# Patient Record
Sex: Male | Born: 1965 | Race: White | Hispanic: No | Marital: Married | Smoking: Never smoker
Health system: Southern US, Community
[De-identification: ages and names within clinical notes are randomized; demographics above are authoritative.]

## PROBLEM LIST (undated history)

## (undated) DIAGNOSIS — K219 Gastro-esophageal reflux disease without esophagitis: Secondary | ICD-10-CM

## (undated) DIAGNOSIS — C801 Malignant (primary) neoplasm, unspecified: Secondary | ICD-10-CM

## (undated) DIAGNOSIS — G4730 Sleep apnea, unspecified: Secondary | ICD-10-CM

## (undated) DIAGNOSIS — M1990 Unspecified osteoarthritis, unspecified site: Secondary | ICD-10-CM

## (undated) DIAGNOSIS — T7840XA Allergy, unspecified, initial encounter: Secondary | ICD-10-CM

## (undated) HISTORY — DX: Allergy, unspecified, initial encounter: T78.40XA

## (undated) HISTORY — PX: MELANOMA EXCISION: SHX5266

## (undated) HISTORY — PX: COLONOSCOPY: SHX5424

---

## 2001-10-10 ENCOUNTER — Ambulatory Visit (HOSPITAL_BASED_OUTPATIENT_CLINIC_OR_DEPARTMENT_OTHER): Admission: RE | Admit: 2001-10-10 | Discharge: 2001-10-10 | Payer: Self-pay | Admitting: General Surgery

## 2001-10-10 ENCOUNTER — Encounter (INDEPENDENT_AMBULATORY_CARE_PROVIDER_SITE_OTHER): Payer: Self-pay | Admitting: *Deleted

## 2005-09-12 ENCOUNTER — Ambulatory Visit (HOSPITAL_COMMUNITY): Admission: RE | Admit: 2005-09-12 | Discharge: 2005-09-12 | Payer: Self-pay | Admitting: Gastroenterology

## 2007-10-08 ENCOUNTER — Observation Stay (HOSPITAL_COMMUNITY): Admission: EM | Admit: 2007-10-08 | Discharge: 2007-10-09 | Payer: Self-pay | Admitting: *Deleted

## 2011-03-06 NOTE — H&P (Signed)
Isaac [name], Isaac [name] NO.:  [id number]   MEDICAL RECORD NO.:  [id number]          PATIENT TYPE:  INP   LOCATION:  0102                         FACILITY:  [facility]   PHYSICIAN:  Hollice Espy, M.D.DATE OF BIRTH:  [birth year]   DATE OF ADMISSION:  10/08/2007  DATE OF DISCHARGE:                              HISTORY & PHYSICAL   PRIMARY CARE PHYSICIAN:  C. Duane Lope, MD   CHIEF COMPLAINT:  Melena.   HISTORY OF PRESENT ILLNESS:  The patient is a 45 year old white male  with no past medical history other than recent flu-like symptoms for the  last 4-5 days.  He says that over the past few days he has probably  taken about a total of 8 Motrin 200 mg with the most at 1 setting 4,  which is within the dose recommended on the bottle.  He says that he has  been fine.  His flu-like symptoms have resolved, but then yesterday he  had an episode of black, tarry stool.  He was a little bit concerned,  but then became more concerned today when he had another episode.  He  has had no other symptoms of lightheadedness, dizziness, nausea,  vomiting, chest pain or other symptoms, but concerned about what was  going on, he came into the emergency room for further evaluation.  In  the emergency room the patient was noted to have a normal white count  with no shift.  Normal H and H.  Normal BUN, minimally elevated  creatinine, and normal blood pressure.  He currently is doing well.  Initially he wanted to leave even against medical advice, but after some  discussion was convinced to stay at least overnight.  Currently he is  doing well.  He denies any headaches, vision changes, dysphagia, chest  pain, palpitations, shortness of breath, wheeze, cough, abdominal pain,  hematuria, dysuria, constipation, diarrhea, focal extremity numbness,  weakness, or pain.   REVIEW OF SYSTEMS:  Otherwise negative.   PAST MEDICAL HISTORY:  None.   MEDICATIONS:  None.   ALLERGIES:  NONE.   SOCIAL HISTORY:  No tobacco, alcohol, or drug use.   FAMILY HISTORY:  Noncontributory.   PHYSICAL EXAMINATION:  VITAL SIGNS:  Temperature 97.7.  Heart rate 101,  now down to 85.  Blood pressure 140/94, down to 124/89.  Respirations  20.  O2 sat 96% on room air.  GENERAL:  He is alert and oriented x3.  In no apparent distress.  HEENT:  Normocephalic.  Atraumatic.  His mucous membranes are moist.  He  has no carotid bruits.  HEART:  Regular rate and rhythm.  S1 and S2.  LUNGS:  Clear to auscultation bilaterally.  ABDOMEN:  Soft, nontender, nondistended.  Positive bowel sounds.  EXTREMITIES:  Show no clubbing, cyanosis, or edema.   LABORATORY WORK:  UA notes trace hemoglobin, trace ketones, with some  calcium oxalate crystals present.  White count 6.2.  H and H 15 and 43.  MCV of 88.  Platelet count of 206.  No shift.  Sodium 126.  Potassium  3.8.  Chloride  100.  Bicarb 28.  BUN 16.  Creatinine 1.19.  Glucose 94.  Acute abdominal series is negative.   ASSESSMENT AND PLAN:  Melena.  Concerned about the possibility of an  upper gastrointestinal bleed.  Convinced the patient to stay overnight.  Will repeat a BMET and hemoglobin and hematocrit in the morning.  In the  meantime monitor his heart rate and blood pressure.  If he has any  decline in his blood pressure or his hemoglobin and hematocrit, or an  increase in his BUN and creatinine, or increase in his heart rate, will  plan to further follow with endoscopy.  The patient is amenable to it.  If his vitals and labs stay stable, plan to discharge the patient on  oral proton pump inhibitor and have advised to stop nonsteroidal anti-  inflammatory drugs and follow up with his primary care physician.      Hollice Espy, M.D.  Electronically Signed     SKK/MEDQ  D:  10/08/2007  T:  10/09/2007  Job:  [id number]   cc:   C. Duane Lope, M.D.  Fax: [phone]

## 2011-03-09 NOTE — Discharge Summary (Signed)
NAMEHALDEN, PHEGLEY NO.:  [id number]   MEDICAL RECORD NO.:  [id number]          PATIENT TYPE:  OBV   LOCATION:  1432                         FACILITY:  [facility]   PHYSICIAN:  Michelene Gardener, MD    DATE OF BIRTH:  08/23/1966   DATE OF ADMISSION:  10/08/2007  DATE OF DISCHARGE:  10/09/2007                               DISCHARGE SUMMARY   PRIMARY CARE PHYSICIAN:  C. Duane Lope, M.D.   DISCHARGE DIAGNOSES:  1. Gastroesophageal reflux disease.  2. Questionable gastrointestinal bleed.   DISCHARGE MEDICATIONS:  None.   PROCEDURES:  None.   CONSULTATIONS:  None.   FOLLOW-UP APPOINTMENT:  With primary physician in 2 weeks.   COURSE OF HOSPITALIZATION:  This is a 45 year old male with no  significant past medical history.  He presented to the hospital with flu-  like symptoms.  He was taking 8of  Motrin for a few days; he one episode  of black stool.  That was questionable for GI bleed because the patient  was on Motrin.  At the time of admission his hemoglobin was 15.2.  Repeat hemoglobin the next day was 14.4, even after IV fluids.  The  patient was admitted to the hospital for observation.  He was given IV  fluids.  There were no more episodes of bleed.  The patient had a guaiac  test that came to be negative.  Vitals remained stable during the  hospital.  I suggested a GI consultation, but the patient refused and he  preferred to follow as an outpatient.  He was advised to stay away from  Motrin at this point.  He was advised to see his primary physician in 1-  2 weeks, to repeat his hemoglobin and to assess the need of GI  evaluation.  Otherwise, his other medical conditions remained stable  during the hospital.   ASSESSMENT TIME:  20 minutes.      Michelene Gardener, MD  Electronically Signed     NAE/MEDQ  D:  12/18/2007  T:  12/19/2007  Job:  [id number]   cc:   C. Duane Lope, M.D.  Fax: [phone]

## 2011-03-09 NOTE — Op Note (Signed)
NAMEPHILLIP, Isaac [name] NO.:  [id number]   MEDICAL RECORD NO.:  [id number]          PATIENT TYPE:  AMB   LOCATION:  ENDO                         FACILITY:  [facility]   PHYSICIAN:  Danise Edge, M.D.   DATE OF BIRTH:  1966-09-15   DATE OF PROCEDURE:  09/12/2005  DATE OF DISCHARGE:                                 OPERATIVE REPORT   PROCEDURE:  Screening colonoscopy.   REFERRED BY:  C. Duane Lope, M.D.   INDICATIONS FOR PROCEDURE:  Mr. Isaac Sedano. [name] is a 45 year old male born  07-25-1966. Mr. Isaac [name] grandmother had colon cancer and his mother  has undergone colonoscopic exams to remove neoplastic colon polyps.   ENDOSCOPIST:  Danise Edge, M.D.   PREMEDICATION:  Versed 7.5 mg, Demerol 70 mg.   DESCRIPTION OF PROCEDURE:  After obtaining informed consent, [name] was  placed in the left lateral decubitus position. I administered intravenous  Demerol and intravenous Versed to achieve conscious sedation for the  procedure. The patient's blood pressure, oxygen saturation and cardiac  rhythm were monitored throughout the procedure and documented in the medical  record.   Anal inspection was normal. Digital rectal exam reveals a non-nodular  prostate. The Olympus adjustable pediatric colonoscope was introduced into  the rectum and advanced to the cecum. A normal appearing ileocecal valve was  identified. Colonic preparation for the exam today was excellent.   RECTUM:  Normal. Retroflexed view of the distal rectum normal.  SIGMOID COLON AND DESCENDING COLON:  Normal.  SPLENIC FLEXURE:  Normal.  TRANSVERSE COLON:  Normal.  HEPATIC FLEXURE:  Normal.  ASCENDING COLON:  Normal.  CECUM AND ILEOCECAL VALVE:  Normal.   ASSESSMENT:  Normal screening proctocolonoscopy to the cecum.   RECOMMENDATIONS:  Colonoscopy in 5-10 years.           ______________________________  Danise Edge, M.D.     MJ/MEDQ  D:  09/12/2005  T:  09/12/2005  Job:  [id number]   cc:   C. Duane Lope, M.D.  Fax: [phone]

## 2011-07-27 LAB — CBC
HCT: 43
Hemoglobin: 15.2
RBC: 4.88
WBC: 6.2

## 2011-07-27 LAB — BASIC METABOLIC PANEL
BUN: 16
Calcium: 8.6
Chloride: 101
GFR calc non Af Amer: >60
Glucose, Bld: 84
Glucose, Bld: 94
Potassium: 3.8
Sodium: 136

## 2011-07-27 LAB — DIFFERENTIAL
Basophils Absolute: 0
Basophils Relative: 0
Eosinophils Absolute: 0.1 — ABNORMAL LOW
Lymphs Abs: 1
Monocytes Absolute: 0.7
Monocytes Relative: 12

## 2011-07-27 LAB — URINALYSIS, ROUTINE W REFLEX MICROSCOPIC

## 2014-07-01 ENCOUNTER — Other Ambulatory Visit: Payer: Self-pay | Admitting: Gastroenterology

## 2014-07-01 DIAGNOSIS — K3189 Other diseases of stomach and duodenum: Secondary | ICD-10-CM

## 2014-07-01 DIAGNOSIS — R1013 Epigastric pain: Principal | ICD-10-CM

## 2014-07-06 ENCOUNTER — Other Ambulatory Visit: Payer: Self-pay

## 2014-07-08 ENCOUNTER — Ambulatory Visit
Admission: RE | Admit: 2014-07-08 | Discharge: 2014-07-08 | Disposition: A | Discharge status: Home / Self Care | Payer: BC Managed Care – PPO | Source: Ambulatory Visit | Attending: Gastroenterology | Admitting: Gastroenterology

## 2014-07-08 ENCOUNTER — Encounter (INDEPENDENT_AMBULATORY_CARE_PROVIDER_SITE_OTHER): Payer: Self-pay

## 2014-07-08 DIAGNOSIS — R1013 Epigastric pain: Principal | ICD-10-CM

## 2014-07-08 DIAGNOSIS — K3189 Other diseases of stomach and duodenum: Secondary | ICD-10-CM

## 2015-11-03 ENCOUNTER — Other Ambulatory Visit: Payer: Self-pay | Admitting: Gastroenterology

## 2015-12-07 ENCOUNTER — Encounter (HOSPITAL_COMMUNITY): Payer: Self-pay | Admitting: *Deleted

## 2015-12-19 ENCOUNTER — Encounter (HOSPITAL_COMMUNITY)
Admission: RE | Disposition: A | Discharge status: Home / Self Care | Payer: Self-pay | Source: Ambulatory Visit | Attending: Gastroenterology

## 2015-12-19 ENCOUNTER — Encounter (HOSPITAL_COMMUNITY): Payer: Self-pay | Admitting: Certified Registered Nurse Anesthetist

## 2015-12-19 ENCOUNTER — Ambulatory Visit (HOSPITAL_COMMUNITY): Payer: BLUE CROSS/BLUE SHIELD | Admitting: Certified Registered Nurse Anesthetist

## 2015-12-19 ENCOUNTER — Ambulatory Visit (HOSPITAL_COMMUNITY)
Admission: RE | Admit: 2015-12-19 | Discharge: 2015-12-19 | Disposition: A | Discharge status: Home / Self Care | Payer: BLUE CROSS/BLUE SHIELD | Source: Ambulatory Visit | Attending: Gastroenterology | Admitting: Gastroenterology

## 2015-12-19 DIAGNOSIS — K219 Gastro-esophageal reflux disease without esophagitis: Secondary | ICD-10-CM | POA: Insufficient documentation

## 2015-12-19 DIAGNOSIS — Z800 Family history of malignant neoplasm of digestive organs: Secondary | ICD-10-CM | POA: Diagnosis not present

## 2015-12-19 DIAGNOSIS — Z85828 Personal history of other malignant neoplasm of skin: Secondary | ICD-10-CM | POA: Insufficient documentation

## 2015-12-19 DIAGNOSIS — Z1211 Encounter for screening for malignant neoplasm of colon: Secondary | ICD-10-CM | POA: Insufficient documentation

## 2015-12-19 DIAGNOSIS — J385 Laryngeal spasm: Secondary | ICD-10-CM | POA: Insufficient documentation

## 2015-12-19 HISTORY — DX: Gastro-esophageal reflux disease without esophagitis: K21.9

## 2015-12-19 HISTORY — PX: COLONOSCOPY WITH PROPOFOL: SHX5780

## 2015-12-19 HISTORY — DX: Sleep apnea, unspecified: G47.30

## 2015-12-19 HISTORY — DX: Unspecified osteoarthritis, unspecified site: M19.90

## 2015-12-19 HISTORY — DX: Malignant (primary) neoplasm, unspecified: C80.1

## 2015-12-19 SURGERY — COLONOSCOPY WITH PROPOFOL
Anesthesia: Monitor Anesthesia Care

## 2015-12-19 MED ORDER — PROPOFOL 10 MG/ML IV BOLUS
INTRAVENOUS | Status: AC
  Filled 2015-12-19: qty 40

## 2015-12-19 MED ORDER — PROPOFOL 500 MG/50ML IV EMUL
INTRAVENOUS | Status: DC | PRN
  Administered 2015-12-19 (×3): 30 mg via INTRAVENOUS

## 2015-12-19 MED ORDER — PROPOFOL 500 MG/50ML IV EMUL
INTRAVENOUS | Status: DC | PRN
  Administered 2015-12-19: 100 ug/kg/min via INTRAVENOUS

## 2015-12-19 MED ORDER — LACTATED RINGERS IV SOLN
INTRAVENOUS | Status: DC
  Administered 2015-12-19: 1000 mL via INTRAVENOUS
  Administered 2015-12-19: 11:00:00 via INTRAVENOUS

## 2015-12-19 MED ORDER — SODIUM CHLORIDE 0.9 % IV SOLN: INTRAVENOUS | Status: DC

## 2015-12-19 MED ORDER — BUTAMBEN-TETRACAINE-BENZOCAINE 2-2-14 % EX AERO
INHALATION_SPRAY | CUTANEOUS | Status: DC | PRN
  Administered 2015-12-19: 1 via TOPICAL

## 2015-12-19 SURGICAL SUPPLY — 24 items

## 2015-12-19 NOTE — Op Note (Signed)
Procedure: Screening colonoscopy. Chronic gastroesophageal reflux. 09/12/2005 normal screening colonoscopy was performed. Maternal grandmother diagnosed with colon cancer.  Endoscopist: Earle Gell  Premedication: Propofol administered by anesthesia  Procedure: Aborted esophagogastroduodenoscopy The patient was placed in the left lateral decubitus position. The Pentax gastroscope was passed through the posterior hypopharynx into the proximal esophagus without difficulty. The gastroscope was passed down the esophagus, into the stomach, and advanced to the second portion of the duodenum. The patient developed laryngospasm. The gastroscope was removed. Laryngospasm was broken by administering masked oxygen and oropharyngeal suctioning.  Repeat attempted esophagogastroduodenoscopy was not performed.  Procedure: Screening colonoscopy Anal inspection and digital rectal exam were normal. The Pentax pediatric colonoscope was introduced into the rectum and advanced to the cecum. A normal-appearing ileocecal valve and appendiceal orifice were identified. Colonic preparation for the exam today was good. Withdrawal time was 7 minutes  Rectum. Normal. Retroflexed view of the distal rectum was normal  Sigmoid colon and descending colon. Normal  Splenic flexure. Normal  Transverse colon. Normal  Hepatic flexure. Normal  Ascending colon. Normal  Cecum and ileocecal valve. Normal  Assessment: Normal screening colonoscopy  Recommendation: Schedule repeat screening colonoscopy in 10 years

## 2015-12-19 NOTE — Progress Notes (Signed)
Prior to discharge, pt was spontaneous breathing on RA and lung sounds were clear bilaterally. Dr. Ermalene Postin was notified and he cleared pt for D/C.

## 2015-12-19 NOTE — Transfer of Care (Signed)
Immediate Anesthesia Transfer of Care Note  Patient: Isaac [name]  Procedure(s) Performed: Procedure(s): COLONOSCOPY WITH PROPOFOL (N/A)  Patient Location: PACU  Anesthesia Type:MAC  Level of Consciousness: awake, alert  and oriented  Airway & Oxygen Therapy: Patient Spontanous Breathing and Patient connected to nasal cannula oxygen  Post-op Assessment: Report given to RN and Post -op Vital signs reviewed and stable  Post vital signs: Reviewed and stable  Last Vitals:  Filed Vitals:   12/19/15 1059  BP: 135/85  Pulse: 75  Temp: 36.7 C  Resp: 12    Complications: No apparent anesthesia complications

## 2015-12-19 NOTE — H&P (Signed)
  Procedure: Screening colonoscopy and diagnostic esophagogastroduodenoscopy. Chronic gastroesophageal reflux. Grandmother diagnosed with colon cancer and mother diagnosed with colon polyps. 09/12/2005 normal screening colonoscopy was performed. 07/08/2014 barium upper GI x-ray showed a tortuous thoracic esophagus and small hiatal hernia  History: The patient is a 50 year old male born Apr 13, 1966. He is scheduled to undergo a screening colonoscopy and diagnostic esophagogastroduodenoscopy today.  Past medical history: Gastroesophageal reflux. Skin cancer removal.  Medication allergies: None  History: Maternal grandmother diagnosed with colon cancer.  Exam: Patient is alert and lying comfortably on the endoscopy stretcher. Abdomen is soft and nontender to palpation. Lungs are clear to auscultation. Cardiac exam reveals a regular rhythm.  Plan: Proceed with screening colonoscopy and diagnostic esophagogastroduodenoscopy.

## 2015-12-19 NOTE — Anesthesia Preprocedure Evaluation (Signed)
Anesthesia Evaluation  Patient identified by MRN, date of birth, ID band Patient awake    Reviewed: Allergy & Precautions, NPO status , Patient's Chart, lab work & pertinent test results  History of Anesthesia Complications Negative for: history of anesthetic complications  Airway Mallampati: II  TM Distance: >3 FB Neck ROM: Full    Dental  (+) Teeth Intact   Pulmonary neg shortness of breath, sleep apnea and Continuous Positive Airway Pressure Ventilation , neg COPD, neg recent URI,    breath sounds clear to auscultation       Cardiovascular negative cardio ROS   Rhythm:Regular     Neuro/Psych negative neurological ROS  negative psych ROS   GI/Hepatic Neg liver ROS, GERD  Medicated and Controlled,  Endo/Other  negative endocrine ROS  Renal/GU      Musculoskeletal   Abdominal   Peds  Hematology negative hematology ROS (+)   Anesthesia Other Findings   Reproductive/Obstetrics                             Anesthesia Physical Anesthesia Plan  ASA: II  Anesthesia Plan: MAC   Post-op Pain Management:    Induction: Intravenous  Airway Management Planned: Natural Airway, Nasal Cannula and Simple Face Mask  Additional Equipment: None  Intra-op Plan:   Post-operative Plan:   Informed Consent: I have reviewed the patients History and Physical, chart, labs and discussed the procedure including the risks, benefits and alternatives for the proposed anesthesia with the patient or authorized representative who has indicated his/her understanding and acceptance.   Dental advisory given  Plan Discussed with: CRNA and Surgeon  Anesthesia Plan Comments:         Anesthesia Quick Evaluation

## 2015-12-19 NOTE — Discharge Instructions (Signed)
Monitored Anesthesia Care Monitored anesthesia care is an anesthesia service for a medical procedure. Anesthesia is the loss of the ability to feel pain. It is produced by medicines called anesthetics. It may affect a small area of your body (local anesthesia), a large area of your body (regional anesthesia), or your entire body (general anesthesia). The need for monitored anesthesia care depends your procedure, your condition, and the potential need for regional or general anesthesia. It is often provided during procedures where:   General anesthesia may be needed if there are complications. This is because you need special care when you are under general anesthesia.   You will be under local or regional anesthesia. This is so that you are able to have higher levels of anesthesia if needed.   You will receive calming medicines (sedatives). This is especially the case if sedatives are given to put you in a semi-conscious state of relaxation (deep sedation). This is because the amount of sedative needed to produce this state can be hard to predict. Too much of a sedative can produce general anesthesia. Monitored anesthesia care is performed by one or more health care providers who have special training in all types of anesthesia. You will need to meet with these health care providers before your procedure. During this meeting, they will ask you about your medical history. They will also give you instructions to follow. (For example, you will need to stop eating and drinking before your procedure. You may also need to stop or change medicines you are taking.) During your procedure, your health care providers will stay with you. They will:   Watch your condition. This includes watching your blood pressure, breathing, and level of pain.   Diagnose and treat problems that occur.   Give medicines if they are needed. These may include calming medicines (sedatives) and anesthetics.   Make sure you are  comfortable.  Having monitored anesthesia care does not necessarily mean that you will be under anesthesia. It does mean that your health care providers will be able to manage anesthesia if you need it or if it occurs. It also means that you will be able to have a different type of anesthesia than you are having if you need it. When your procedure is complete, your health care providers will continue to watch your condition. They will make sure any medicines wear off before you are allowed to go home.    This information is not intended to replace advice given to you by your health care provider. Make sure you discuss any questions you have with your health care provider.   Document Released: 07/04/2005 Document Revised: 10/29/2014 Document Reviewed: 11/19/2012 Elsevier Interactive Patient Education 2016 [employer] Colonoscopy, Care After Refer to this sheet in the next few weeks. These instructions provide you with information on caring for yourself after your procedure. Your health care provider may also give you more specific instructions. Your treatment has been planned according to current medical practices, but problems sometimes occur. Call your health care provider if you have any problems or questions after your procedure. WHAT TO EXPECT AFTER THE PROCEDURE  After your procedure, it is typical to have the following:  A small amount of blood in your stool.  Moderate amounts of gas and mild abdominal cramping or bloating. HOME CARE INSTRUCTIONS  Do not drive, operate machinery, or sign important documents for 24 hours.  You may shower and resume your regular physical activities, but move at a slower pace for  the first 24 hours.  Take frequent rest periods for the first 24 hours.  Walk around or put a warm pack on your abdomen to help reduce abdominal cramping and bloating.  Drink enough fluids to keep your urine clear or pale yellow.  You may resume your normal diet as  instructed by your health care provider. Avoid heavy or fried foods that are hard to digest.  Avoid drinking alcohol for 24 hours or as instructed by your health care provider.  Only take over-the-counter or prescription medicines as directed by your health care provider.  If a tissue sample (biopsy) was taken during your procedure:  Do not take aspirin or blood thinners for 7 days, or as instructed by your health care provider.  Do not drink alcohol for 7 days, or as instructed by your health care provider.  Eat soft foods for the first 24 hours. SEEK MEDICAL CARE IF: You have persistent spotting of blood in your stool 2-3 days after the procedure. SEEK IMMEDIATE MEDICAL CARE IF:  You have more than a small spotting of blood in your stool.  You pass large blood clots in your stool.  Your abdomen is swollen (distended).  You have nausea or vomiting.  You have a fever.  You have increasing abdominal pain that is not relieved with medicine.   This information is not intended to replace advice given to you by your health care provider. Make sure you discuss any questions you have with your health care provider.   Document Released: 05/22/2004 Document Revised: 07/29/2013 Document Reviewed: 06/15/2013 Elsevier Interactive Patient Education [employer].

## 2015-12-20 ENCOUNTER — Encounter (HOSPITAL_COMMUNITY): Payer: Self-pay | Admitting: Gastroenterology

## 2015-12-22 NOTE — Anesthesia Postprocedure Evaluation (Signed)
Anesthesia Post Note  Patient: Isaac [name]  Procedure(s) Performed: Procedure(s) (LRB): COLONOSCOPY WITH PROPOFOL (N/A)  Patient location during evaluation: Endoscopy Anesthesia Type: MAC Level of consciousness: awake Pain management: pain level controlled Vital Signs Assessment: post-procedure vital signs reviewed and stable Respiratory status: spontaneous breathing Cardiovascular status: stable Postop Assessment: no signs of nausea or vomiting Anesthetic complications: no    Last Vitals:  Filed Vitals:   12/19/15 1250 12/19/15 1258  BP: 92/56 97/67  Pulse: 71 73  Temp:    Resp: 14 14    Last Pain: There were no vitals filed for this visit.               Minh Jasper

## 2016-01-22 IMAGING — RF DG UGI W/ HIGH DENSITY W/KUB
19 of 24 series · 19 of 24 positions shown · non-contrast
Comparison: Abdomen films of 10/08/2007

CLINICAL DATA: Indigestion

EXAM:
UPPER GI SERIES WITH KUB
TECHNIQUE: After obtaining a scout radiograph a routine upper GI series was
performed using thin and high density barium.
FLUOROSCOPY TIME:  2 min 0 seconds

[Series 1: run · 1 of 1 slices shown (1 of 19)]
[im 1/1]
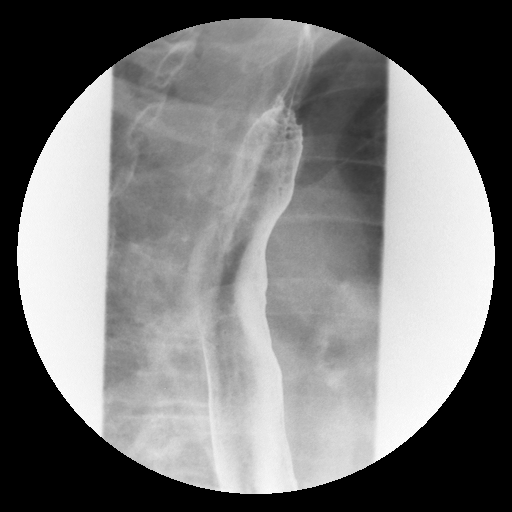

[Series 2: run · 1 of 1 slices shown (2 of 19)]
[im 1/1]
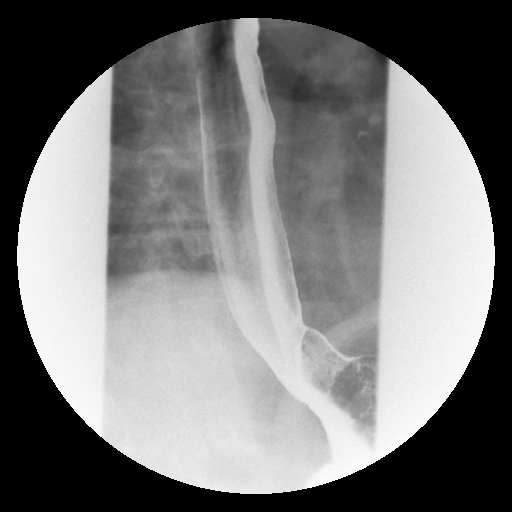

[Series 4: run · 1 of 1 slices shown (3 of 19)]
[im 1/1]
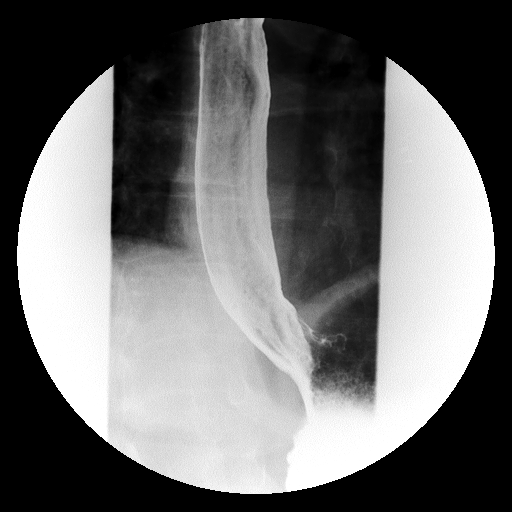

[Series 5: run · 1 of 1 slices shown (4 of 19)]
[im 1/1]
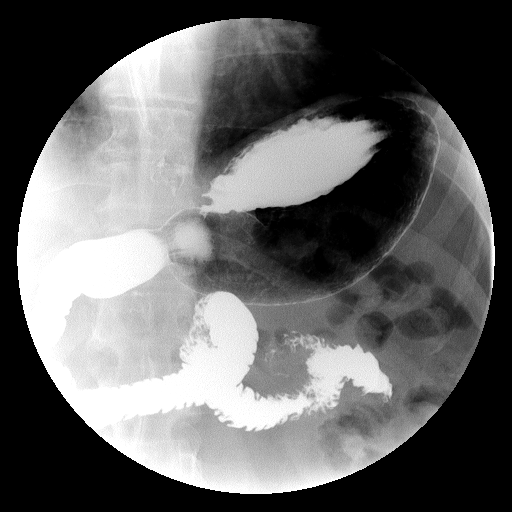

[Series 6: run · 1 of 1 slices shown (5 of 19)]
[im 1/1]
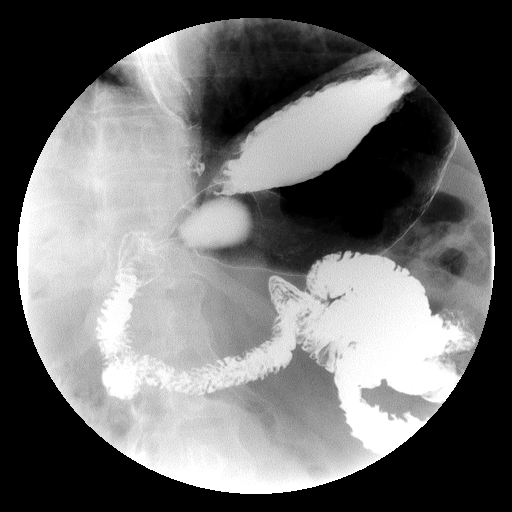

[Series 7: run · 1 of 1 slices shown (6 of 19)]
[im 1/1]
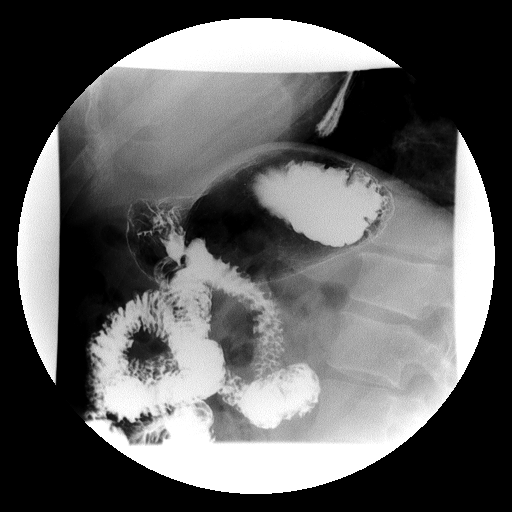

[Series 9: run · 1 of 1 slices shown (7 of 19)]
[im 1/1]
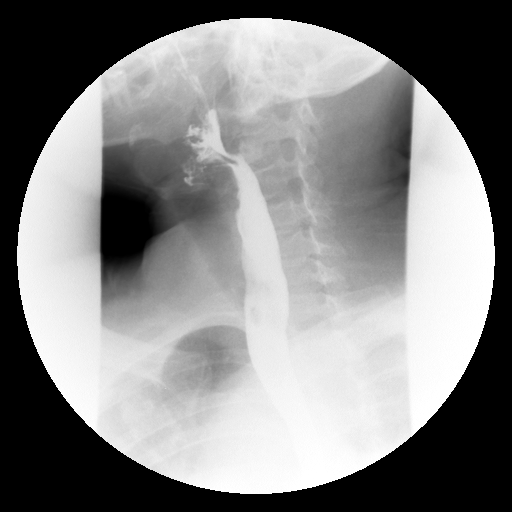

[Series 10: run · 1 of 1 slices shown (8 of 19)]
[im 1/1]
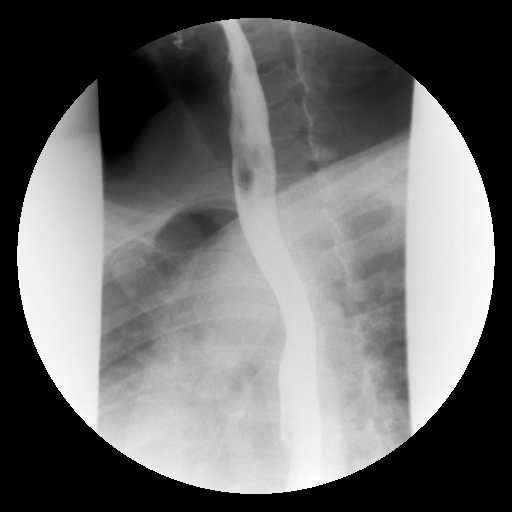

[Series 11: run · 1 of 1 slices shown (9 of 19)]
[im 1/1]
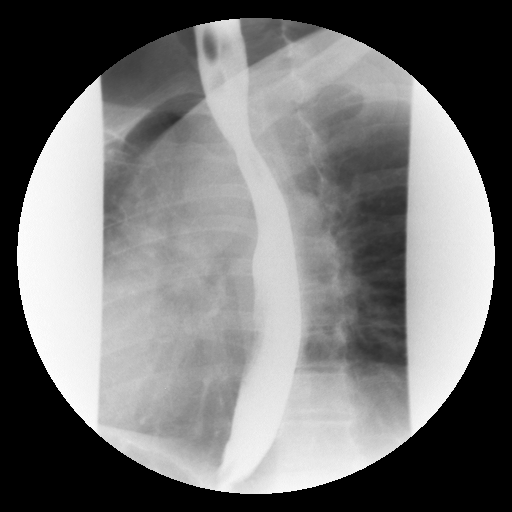

[Series 13: run · 1 of 1 slices shown (10 of 19)]
[im 1/1]
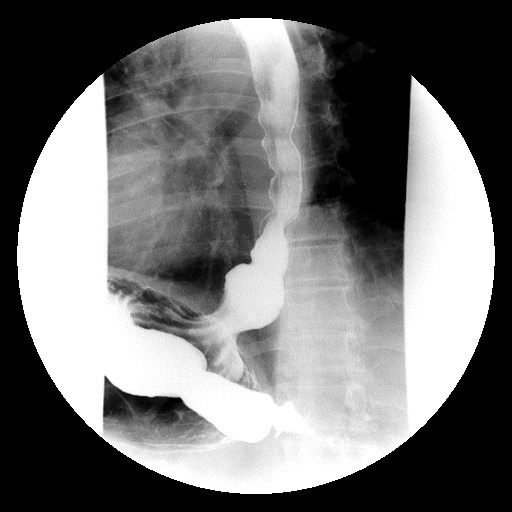

[Series 14: run · 1 of 1 slices shown (11 of 19)]
[im 1/1]
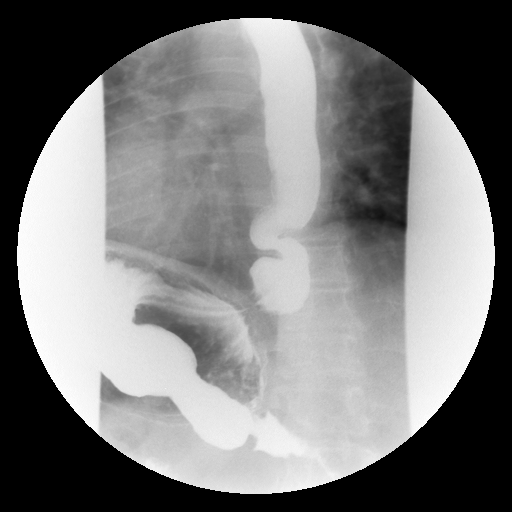

[Series 15: run · 1 of 1 slices shown (12 of 19)]
[im 1/1]
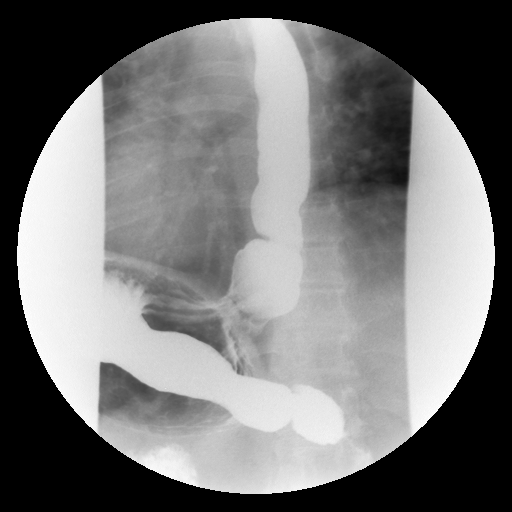

[Series 16: run · 1 of 1 slices shown (13 of 19)]
[im 1/1]
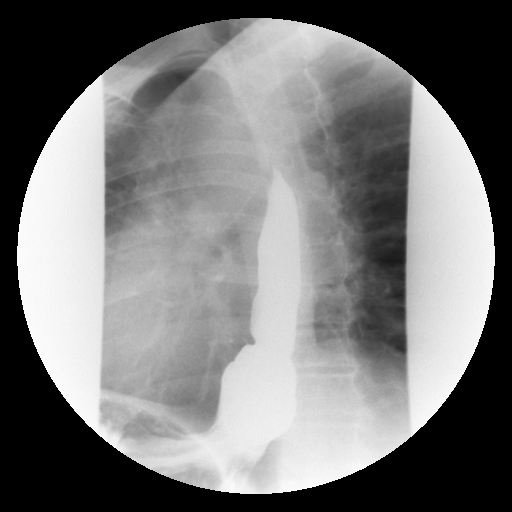

[Series 18: run · 1 of 1 slices shown (14 of 19)]
[im 1/1]
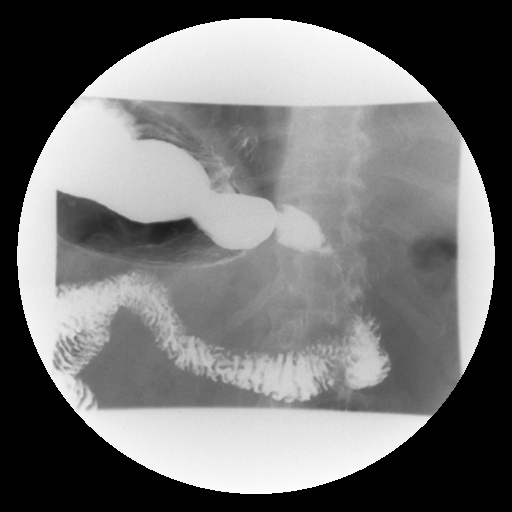

[Series 19: run · 1 of 1 slices shown (15 of 19)]
[im 1/1]
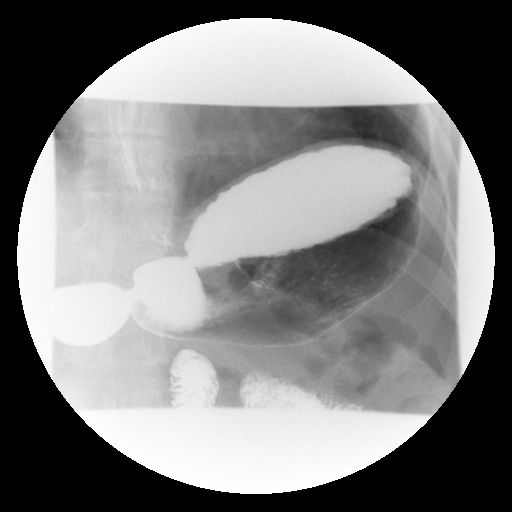

[Series 20: run · 1 of 1 slices shown (16 of 19)]
[im 1/1]
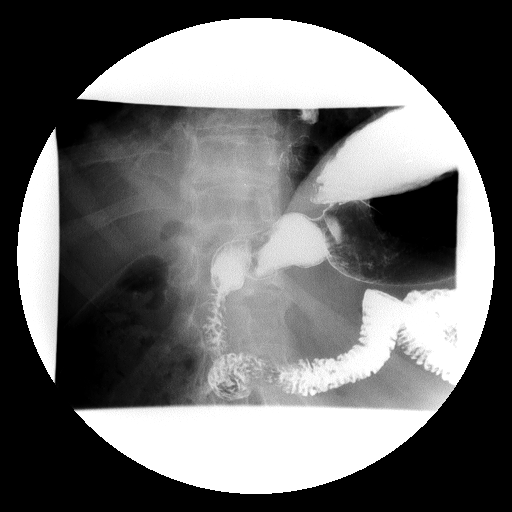

[Series 21: run · 1 of 1 slices shown (17 of 19)]
[im 1/1]
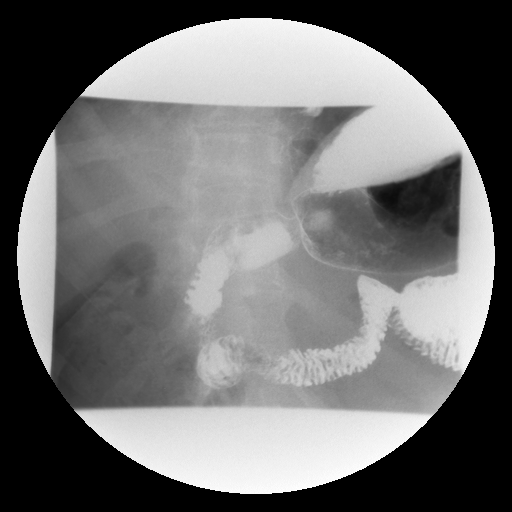

[Series 24: run · 1 of 1 slices shown (18 of 19)]
[im 1/1]
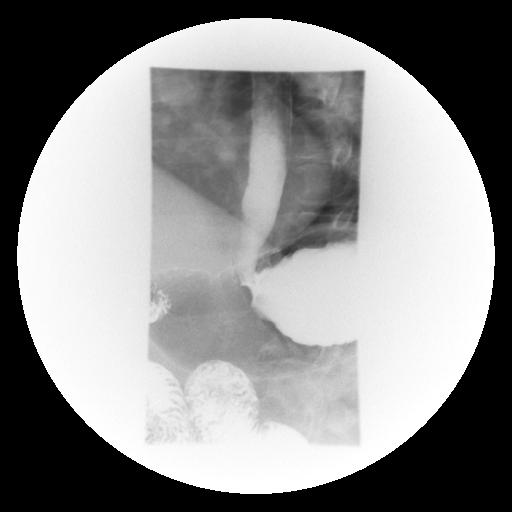

[Series 25: run · 1 of 8 slices shown (19 of 19)]
[im 1/8]
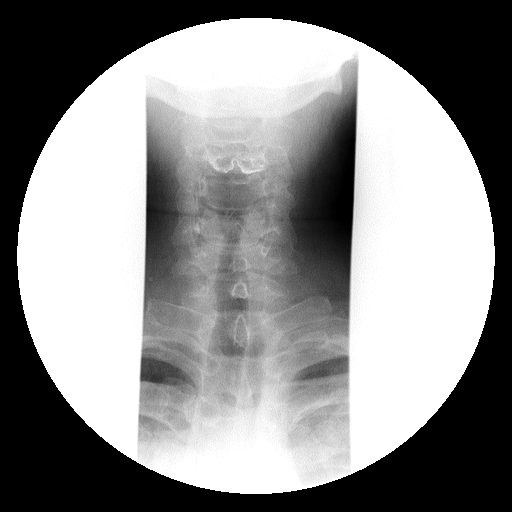

[19 of 24 positions shown; findings below may reference images not displayed]

FINDINGS: A preliminary film of the abdomen shows a nonspecific bowel gas
pattern. No opaque calculi are seen.

A double contrast upper GI was performed. The mucosa of the
esophagus is unremarkable. A single contrast study shows the
swallowing mechanism to be normal. Esophageal peristalsis is normal.
The distal esophagus is somewhat tortuous. However, there is a small
hiatal hernia present. Moderate gastroesophageal reflux is
demonstrated. A barium pill was given at the end of the study which
passed into the stomach without delay.

The stomach is normal in contour and peristalsis. The duodenal bulb
fills and the duodenal loop is in normal position.
IMPRESSION: 1. Small hiatal hernia with moderate gastroesophageal reflux. Barium
pill passes into the stomach without delay.
2. Somewhat tortuous distal thoracic esophagus.
3. No abnormality of the stomach or duodenum.

## 2021-03-03 ENCOUNTER — Other Ambulatory Visit: Payer: Self-pay | Admitting: Family Medicine

## 2021-03-03 DIAGNOSIS — E7800 Pure hypercholesterolemia, unspecified: Secondary | ICD-10-CM

## 2022-03-26 ENCOUNTER — Other Ambulatory Visit: Payer: Self-pay | Admitting: Family Medicine

## 2022-03-26 DIAGNOSIS — Z0000 Encounter for general adult medical examination without abnormal findings: Secondary | ICD-10-CM | POA: Diagnosis not present

## 2022-03-26 DIAGNOSIS — E559 Vitamin D deficiency, unspecified: Secondary | ICD-10-CM | POA: Diagnosis not present

## 2022-03-26 DIAGNOSIS — Z13220 Encounter for screening for lipoid disorders: Secondary | ICD-10-CM | POA: Diagnosis not present

## 2022-03-26 DIAGNOSIS — Z125 Encounter for screening for malignant neoplasm of prostate: Secondary | ICD-10-CM | POA: Diagnosis not present

## 2022-07-27 ENCOUNTER — Ambulatory Visit
Admission: RE | Admit: 2022-07-27 | Discharge: 2022-07-27 | Disposition: A | Discharge status: Home / Self Care | Payer: No Typology Code available for payment source | Source: Ambulatory Visit | Attending: Family Medicine | Admitting: Family Medicine

## 2022-07-27 DIAGNOSIS — Z0000 Encounter for general adult medical examination without abnormal findings: Secondary | ICD-10-CM

## 2022-08-17 ENCOUNTER — Ambulatory Visit: Payer: BLUE CROSS/BLUE SHIELD | Admitting: Family Medicine

## 2022-08-29 ENCOUNTER — Ambulatory Visit (INDEPENDENT_AMBULATORY_CARE_PROVIDER_SITE_OTHER): Payer: Self-pay | Admitting: Family Medicine

## 2022-08-29 ENCOUNTER — Encounter: Payer: Self-pay | Admitting: Family Medicine

## 2022-08-29 VITALS — BP 130/76 | HR 71 | Temp 98.1°F | Ht 71.0 in | Wt 208.2 lb

## 2022-08-29 DIAGNOSIS — Z800 Family history of malignant neoplasm of digestive organs: Secondary | ICD-10-CM

## 2022-08-29 DIAGNOSIS — C439 Malignant melanoma of skin, unspecified: Secondary | ICD-10-CM

## 2022-08-29 DIAGNOSIS — M25561 Pain in right knee: Secondary | ICD-10-CM

## 2022-08-29 DIAGNOSIS — R911 Solitary pulmonary nodule: Secondary | ICD-10-CM

## 2022-08-29 DIAGNOSIS — M25572 Pain in left ankle and joints of left foot: Secondary | ICD-10-CM

## 2022-08-29 DIAGNOSIS — G8929 Other chronic pain: Secondary | ICD-10-CM

## 2022-08-29 DIAGNOSIS — Z8249 Family history of ischemic heart disease and other diseases of the circulatory system: Secondary | ICD-10-CM

## 2022-08-29 NOTE — Progress Notes (Signed)
New Patient Office Visit  Subjective:  Patient ID: Isaac [name], adult    DOB: Aug 14, 1966  Age: 56 y.o. MRN: [id number]  CC:  Chief Complaint  [name] with   Establish Care    Need new pcp Not fasting    Pain    Pain in left ankle and right knee     HPI Isaac [name] for new pt-Dr. Jonna [name]  FH CAD-pt had cardiac CT recently-OK.  No symptoms. Some nodules in lungs and repeat 1 yr. -pt wants to do 44morepeat.   FH colon Ca-mgma-so cscope-last at 56yo. Some OA-taking joint support. 1.5 yrs ago, injured L ankle going up ditch.  hurts in front.  Can be really painful.  5 separate times, could barely walk.   R knee medial side-woke up in middle of night burning-can occur driving-954mo [place]-not wearing cpap-tried for 6 mo and can't sleep w/it. Sleeps elevated some  Had annual in Jan.-chol a little high.     Past Medical History:  Diagnosis Date   Allergy    Arthritis    arthritis -pain "back"   [facility]   Melanoma"lower hip-right" -15 yrs ago   GERD (gastroesophageal reflux disease)    intermittent- uses OTC med   Sleep apnea    settings "automatic" cpap use x2-3 weeks    Past Surgical History:  Procedure Laterality Date   COLONOSCOPY     COLONOSCOPY WITH PROPOFOL N/A 12/19/2015   Procedure: COLONOSCOPY WITH PROPOFOL;  Surgeon: MaGarlan FairMD;  [facility];  Service: Endoscopy;  Laterality: N/A;   MELANOMA EXCISION Right    right hip 15 yrs ago    Family History  Problem Relation Age of Onset   Cancer Maternal Grandmother [id number]     colon   Heart disease Maternal Grandfather [id number]     deceased    Social History   Socioeconomic History   Marital status: Married    Spouse name: Not on file   Number of children: 2   Years of education: Not on file   Highest education level: Not on file  Occupational History   Not on file  Tobacco Use   Smoking status: Never   Smokeless tobacco: Not on file  Vaping Use   Vaping Use:  Never used  Substance and Sexual Activity   Alcohol use: No   Drug use: No   Sexual activity: Yes  Other Topics Concern   Not on file  Social History Narrative   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   Financial Resource Strain: Not on file  Food Insecurity: Not on file  Transportation Needs: Not on file  Physical Activity: Not on file  Stress: Not on file  Social Connections: Not on file  Intimate Partner Violence: Not on file    ROS  ROS: Gen: no fever, chills  Skin: no rash, itching ENT: no ear pain, ear drainage, nasal congestion, rhinorrhea, sinus pressure, sore throat Eyes: no blurry vision, double vision Resp: no cough, wheeze,SOB CV: no CP, palpitations, LE edema,  GI: no heartburn, n/v/d/c, abd pain GU: no dysuria, urgency, frequency, hematuria MSK: HPI Neuro: no dizziness, headache, weakness, vertigo Psych: no depression, anxiety, insomnia, SI   Objective:   Today's Vitals: BP 130/76   Pulse 71   Temp 98.1 F (36.7 C) (Temporal)   Ht '5\' 11"'$  (1.803 m)   Wt 208 lb 4 oz (94.5 kg)  SpO2 95%   BMI 29.04 kg/m   Physical Exam  Gen: WDWN NAD HEENT: NCAT, conjunctiva not injected, sclera nonicteric NECK:  supple, no thyromegaly, no nodes, no carotid bruits CARDIAC: RRR, S1S2+, no murmur. DP 2+B LUNGS: CTAB. No wheezes ABDOMEN:  BS+, soft, NTND, No HSM, no masses EXT:  no edema MSK: no gross abnormalities.  R knee-no ttp, neg lachman/McMurray.  L ankle-no TTP, FROM NEURO: A&O x3.  CN II-XII intact.  PSYCH: normal mood. Good eye contact   Assessment & Plan:   Problem List Items Addressed This Visit       Respiratory   Lung nodule seen on imaging study     Musculoskeletal and Integument   Melanoma of skin [name]     Other   FH: CAD (coronary artery disease)   FH: colon cancer in first degree relative <46 years old   Other Visit Diagnoses     Chronic pain of right knee    -  Primary   Chronic pain of left ankle         1  R  knee pain-?meniscus-intemitt-to sports med 2.  L ankle pain-chronic  stretches  to sports med 3.  [place] CAD-pt working on diet/exercise-per pt, neg CT  4  FH colon ca-colon utd 5.  Nodules on lung-h/o melanoma-repeat 6 mo  Outpatient Encounter Medications as of 08/29/2022  Medication Sig   [DISCONTINUED] pantoprazole (PROTONIX) 40 MG tablet Take 40 mg by mouth daily as needed (Acid Reflux).  (Patient not taking: Reported on 08/29/2022)   No facility-administered encounter medications on file as of 08/29/2022.    Follow-up: Return in about 3 months (around 11/29/2022) for annual.   Wellington Hampshire, MD

## 2022-08-29 NOTE — Patient Instructions (Addendum)
Welcome to [facility]! It was a pleasure meeting you today.  As discussed, Please schedule a 4 month follow up visit today. For annual  [phone]- for sports medicine-Dr. Lynne Leader  PLEASE NOTE:  If you had any LAB tests please let us know if you have not heard back within a few days. You may see your results on MyChart before we have a chance to review them but we will give you a call once they are reviewed by [place]. If we ordered any REFERRALS today, please let us know if you have not heard from their office within the next week.  Let us know through MyChart if you are needing REFILLS, or have your pharmacy send [place] the request. You can also use MyChart to communicate with me or any office staff.  Please try these tips to maintain a healthy lifestyle:  Eat most of your calories during the day when you are active. Eliminate processed foods including packaged sweets (pies, cakes, cookies), reduce intake of potatoes, white bread, white pasta, and white rice. Look for whole grain options, oat flour or almond flour.  Each meal should contain half fruits/vegetables, one quarter protein, and one quarter carbs (no bigger than a computer mouse).  Cut down on sweet beverages. This includes juice, soda, and sweet tea. Also watch fruit intake, though this is a healthier sweet option, it still contains natural sugar! Limit to 3 servings daily.  Drink at least 1 glass of water with each meal and aim for at least 8 glasses per day  Exercise at least 150 minutes every week.

## 2022-09-20 ENCOUNTER — Ambulatory Visit: Payer: 59 | Admitting: Sports Medicine

## 2022-09-20 ENCOUNTER — Ambulatory Visit (INDEPENDENT_AMBULATORY_CARE_PROVIDER_SITE_OTHER): Payer: 59

## 2022-09-20 VITALS — BP 150/90 | HR 83 | Ht 71.0 in | Wt 208.0 lb

## 2022-09-20 DIAGNOSIS — M545 Low back pain, unspecified: Secondary | ICD-10-CM

## 2022-09-20 DIAGNOSIS — G8929 Other chronic pain: Secondary | ICD-10-CM

## 2022-09-20 DIAGNOSIS — M4316 Spondylolisthesis, lumbar region: Secondary | ICD-10-CM | POA: Diagnosis not present

## 2022-09-20 MED ORDER — METHOCARBAMOL 500 MG PO TABS: 500.0000 mg | ORAL_TABLET | Freq: Two times a day (BID) | ORAL | 0 refills | Status: DC | PRN

## 2022-09-20 MED ORDER — MELOXICAM 15 MG PO TABS: 15.0000 mg | ORAL_TABLET | Freq: Every day | ORAL | 0 refills | Status: DC

## 2022-09-20 MED ORDER — METHOCARBAMOL 500 MG PO TABS: 750.0000 mg | ORAL_TABLET | Freq: Two times a day (BID) | ORAL | 0 refills | Status: DC

## 2022-09-20 NOTE — Patient Instructions (Addendum)
Good to see you - Start meloxicam 15 mg daily x2 weeks.  If still having pain after 2 weeks, complete 3rd-week of meloxicam. May use remaining meloxicam as needed once daily for pain control.  Do not to use additional NSAIDs while taking meloxicam.  May use Tylenol [phone] mg 2 to 3 times a day for breakthrough pain. Robaxin 500 mg 1-2 times a day as needed for muscle spasms Low back HEP can start Monday  No lifting more than 10 pounds 3 week follow up

## 2022-09-20 NOTE — Progress Notes (Signed)
Isaac [name] D.[place] Phone: [phone]   Assessment and Plan:     1. Chronic right-sided low back pain without sciatica -Chronic with exacerbation, initial sports medicine visit - Most consistent with acute strain of right lumbar paraspinal and right lower back musculature with underlying lumbar DDD and retrolisthesis - Start meloxicam 15 mg daily x2 weeks.  If still having pain after 2 weeks, complete 3rd-week of meloxicam. May use remaining meloxicam as needed once daily for pain control.  Do not to use additional NSAIDs while taking meloxicam.  May use Tylenol [phone] mg 2 to 3 times a day for breakthrough pain. - Start Robaxin 500 mg 1-2 times per day as needed for muscle spasms - HEP provided.  Recommend waiting until next week to start HEP to prevent worsening pain - Recommend not lifting any more than 15 pounds to prevent recurrence of pain - X-ray obtained in clinic.  My interpretation: No acute fracture or vertebral collapse.  Grade 1 retrolisthesis of L3 on L4 and facet arthropathy L3-L5 - DG Lumbar Spine 2-3 Views; Future  Other orders - meloxicam (MOBIC) 15 MG tablet; Take 1 tablet (15 mg total) by mouth daily. - methocarbamol (ROBAXIN) 500 MG tablet; Take 1.5 tablets (750 mg total) by mouth in the morning and at bedtime.    Pertinent previous records reviewed include none   Follow Up: 3 weeks for reevaluation.  Could consider advanced imaging if no improvement or worsening of symptoms.  Could consider OMT if incomplete relief of symptoms.  Could also evaluate patient's other chronic ailments including right knee or right ankle pain based on remaining symptoms   Subjective:   I, Isaac [name], am serving as a Education administrator for [employer]  Chief Complaint: low back pain   HPI:  [facility] 09/20/22 Patient is a 27 male complaining of low back pain. Patient states that low back pain  started 3 days ago , he felt a tweak 4-5 days ago ha progressed into a flare, wasn't able to lay down or sit or just get comfortable, he heard a pop ,  no radiating pain , hx of kidney stones , tylenol for the pain and he can't tell if it helps , pain with all motions    Relevant Historical Information: None pertinent  Additional pertinent review of systems negative.   Current Outpatient Medications:    meloxicam (MOBIC) 15 MG tablet, Take 1 tablet (15 mg total) by mouth daily., Disp: 30 tablet, Rfl: 0   methocarbamol (ROBAXIN) 500 MG tablet, Take 1.5 tablets (750 mg total) by mouth in the morning and at bedtime., Disp: 30 tablet, Rfl: 0   Objective:     Vitals:   09/20/22 1427  BP: (!) 150/90  Pulse: 83  SpO2: 99%  Weight: 208 lb (94.3 kg)  Height: '5\' 11"'$  (1.803 m)      Body mass index is 29.01 kg/m.    Physical Exam:    Gen: Appears well, nad, nontoxic and pleasant Psych: Alert and oriented, appropriate mood and affect Neuro: sensation intact, strength is 5/5 in upper and lower extremities, muscle tone wnl Skin: no susupicious lesions or rashes  Back - Normal skin, Spine with normal alignment and no deformity.   No tenderness to vertebral process palpation.   Paraspinous muscles are not tender and without spasm NTTP gluteal musculature Straight leg raise negative for radicular symptoms, though reproduced right-sided low back pain Trendelenberg  negative Piriformis Test negative  Right-sided low back pain with lumbar flexion Negative Lloyd's elevated  Electronically signed by:  Isaac [name] D.[facility] 3:15 PM 09/20/22

## 2022-10-12 ENCOUNTER — Ambulatory Visit: Payer: 59 | Admitting: Sports Medicine

## 2022-10-17 ENCOUNTER — Other Ambulatory Visit: Payer: Self-pay | Admitting: Sports Medicine

## 2023-01-23 ENCOUNTER — Other Ambulatory Visit: Payer: Self-pay | Admitting: Family Medicine

## 2023-01-23 DIAGNOSIS — R931 Abnormal findings on diagnostic imaging of heart and coronary circulation: Secondary | ICD-10-CM

## 2023-02-22 ENCOUNTER — Ambulatory Visit
Admission: RE | Admit: 2023-02-22 | Discharge: 2023-02-22 | Disposition: A | Discharge status: Home / Self Care | Payer: 59 | Source: Ambulatory Visit | Attending: Family Medicine | Admitting: Family Medicine

## 2023-02-22 DIAGNOSIS — Z85820 Personal history of malignant melanoma of skin: Secondary | ICD-10-CM | POA: Diagnosis not present

## 2023-02-22 DIAGNOSIS — R931 Abnormal findings on diagnostic imaging of heart and coronary circulation: Secondary | ICD-10-CM

## 2023-02-22 DIAGNOSIS — R911 Solitary pulmonary nodule: Secondary | ICD-10-CM | POA: Diagnosis not present

## 2023-02-22 DIAGNOSIS — I719 Aortic aneurysm of unspecified site, without rupture: Secondary | ICD-10-CM | POA: Diagnosis not present

## 2023-03-05 DIAGNOSIS — L814 Other melanin hyperpigmentation: Secondary | ICD-10-CM | POA: Diagnosis not present

## 2023-03-05 DIAGNOSIS — Z85820 Personal history of malignant melanoma of skin: Secondary | ICD-10-CM | POA: Diagnosis not present

## 2023-03-05 DIAGNOSIS — Z08 Encounter for follow-up examination after completed treatment for malignant neoplasm: Secondary | ICD-10-CM | POA: Diagnosis not present

## 2023-03-05 DIAGNOSIS — D2372 Other benign neoplasm of skin of left lower limb, including hip: Secondary | ICD-10-CM | POA: Diagnosis not present

## 2023-03-05 DIAGNOSIS — B078 Other viral warts: Secondary | ICD-10-CM | POA: Diagnosis not present

## 2023-03-05 DIAGNOSIS — L821 Other seborrheic keratosis: Secondary | ICD-10-CM | POA: Diagnosis not present

## 2023-03-05 DIAGNOSIS — L570 Actinic keratosis: Secondary | ICD-10-CM | POA: Diagnosis not present

## 2023-03-19 ENCOUNTER — Encounter: Payer: Self-pay | Admitting: Family Medicine

## 2023-03-19 ENCOUNTER — Telehealth (INDEPENDENT_AMBULATORY_CARE_PROVIDER_SITE_OTHER): Payer: 59 | Admitting: Family Medicine

## 2023-03-19 DIAGNOSIS — J0100 Acute maxillary sinusitis, unspecified: Secondary | ICD-10-CM

## 2023-03-19 MED ORDER — AMOXICILLIN-POT CLAVULANATE 875-125 MG PO TABS: 1.0000 | ORAL_TABLET | Freq: Two times a day (BID) | ORAL | 0 refills | Status: DC

## 2023-03-19 NOTE — Progress Notes (Signed)
MyChart Video Visit Virtual Visit via Video Note   This visit type was conducted w/patient consent. This format is felt to be most appropriate for this patient at this time. Physical exam was limited by quality of the video and audio technology used for the visit. CMA was able to get the patient set up on a video visit.  Patient location: Home. Patient and provider in visit Provider location: Office  I discussed the limitations of evaluation and management by telemedicine and the availability of in person appointments. The patient expressed understanding and agreed to proceed.  Visit Date: 03/19/2023  Today's healthcare provider: Angelena Sole, MD     Subjective:    Patient ID: Isaac [name], male    DOB: September 13, 1966, 57 y.o.   MRN: [id number]  Chief Complaint  Patient presents with   Nasal Congestion    Sx started last Wednesday   Sinusitis    HPI Sinus congestion for 5 days. Was mowing a lot and then felt achey and "sick", pressure face.  Nose was swollen.  Some better today. Taking OTC.  Helps some.  No fevers/chills.  Cough from drainage.  No shortness of breath.  Green mucus. Had bad sore throat but better now.   Past Medical History:  Diagnosis Date   Allergy    Arthritis    arthritis -pain "back"   Cancer [facility])    Melanoma"lower hip-right" -15 yrs ago   GERD (gastroesophageal reflux disease)    intermittent- uses OTC med   Sleep apnea    settings "automatic" cpap use x2-3 weeks    Past Surgical History:  Procedure Laterality Date   COLONOSCOPY     COLONOSCOPY WITH PROPOFOL N/A 12/19/2015   Procedure: COLONOSCOPY WITH PROPOFOL;  Surgeon: Charolett Bumpers, MD;  [facility];  Service: Endoscopy;  Laterality: N/A;   MELANOMA EXCISION Right    right hip 15 yrs ago    Outpatient Medications Prior to Visit  Medication Sig Dispense Refill   meloxicam (MOBIC) 15 MG tablet Take 1 tablet (15 mg total) by mouth daily. (Patient not taking: Reported on  03/19/2023) 30 tablet 0   methocarbamol (ROBAXIN) 500 MG tablet Take 1 tablet (500 mg total) by mouth 2 (two) times daily as needed for muscle spasms. (Patient not taking: Reported on 03/19/2023) 30 tablet 0   No facility-administered medications prior to visit.    No Known Allergies      Objective:     Physical Exam  Vitals and nursing note reviewed.  Constitutional:      General:  is not in acute distress.    Appearance: Normal appearance.  HENT:     Head: Normocephalic.  Pulmonary:     Effort: No respiratory distress.  Skin:    General: Skin is dry.     Coloration: Skin is not pale.  Neurological:     Mental Status: Pt is alert and oriented to person, place, and time.  Psychiatric:        Mood and Affect: Mood normal.   There were no vitals taken for this visit.  Wt Readings from Last 3 Encounters:  09/20/22 208 lb (94.3 kg)  08/29/22 208 lb 4 oz (94.5 kg)  12/19/15 190 lb (86.2 kg)       Assessment & Plan:   Problem List Items Addressed This Visit   None Visit Diagnoses     Acute non-recurrent maxillary sinusitis    -  Primary   Relevant Medications   amoxicillin-clavulanate (  AUGMENTIN) 875-125 MG tablet      Sinusitis-augmentin 875 twice daily.  Can use Flonase and astelin OTC if wants.    Meds ordered this encounter  Medications   amoxicillin-clavulanate (AUGMENTIN) 875-125 MG tablet    Sig: Take 1 tablet by mouth 2 (two) times daily.    Dispense:  20 tablet    Refill:  0    I discussed the assessment and treatment plan with the patient. The patient was provided an opportunity to ask questions and all were answered. The patient agreed with the plan and demonstrated an understanding of the instructions.   The patient was advised to call back or seek an in-person evaluation if the symptoms worsen or if the condition fails to improve as anticipated.    Angelena Sole, MD [facility] [place] [phone] (phone) [phone]  (fax)  [facility]

## 2023-03-19 NOTE — Patient Instructions (Signed)
Astelin(azelastin) and flonase

## 2023-04-08 DIAGNOSIS — I7121 Aneurysm of the ascending aorta, without rupture: Secondary | ICD-10-CM | POA: Diagnosis not present

## 2023-04-08 DIAGNOSIS — Z6829 Body mass index (BMI) 29.0-29.9, adult: Secondary | ICD-10-CM | POA: Diagnosis not present

## 2023-04-08 DIAGNOSIS — R6882 Decreased libido: Secondary | ICD-10-CM | POA: Diagnosis not present

## 2023-04-08 DIAGNOSIS — Z125 Encounter for screening for malignant neoplasm of prostate: Secondary | ICD-10-CM | POA: Diagnosis not present

## 2023-04-08 DIAGNOSIS — Z13220 Encounter for screening for lipoid disorders: Secondary | ICD-10-CM | POA: Diagnosis not present

## 2023-04-08 DIAGNOSIS — E559 Vitamin D deficiency, unspecified: Secondary | ICD-10-CM | POA: Diagnosis not present

## 2023-04-08 DIAGNOSIS — Z0000 Encounter for general adult medical examination without abnormal findings: Secondary | ICD-10-CM | POA: Diagnosis not present

## 2023-06-03 NOTE — Progress Notes (Unsigned)
Cardiology Office Note:   Date:  06/05/2023  NAME:  Isaac [name]    MRN: [id number] DOB:  [birth year]   PCP:  Jeani Sow, MD  Cardiologist:  Reatha Harps, MD  Electrophysiologist:  None   Referring MD: Daisy Floro, MD   Chief Complaint  Patient presents with   Thoracic Aortic Aneurysm    History of Present Illness:   Isaac [name] is a 57 y.o. male with a hx of aortic aneurysm who is being seen today for the evaluation of aortic aneurysm at the request of Daisy Floro, MD. Incidentally found aortic aneurysm. Denies CP or SOB. No HTN. No family history of heart disease or aorta issues. Married with 2 kids. CAC = 0. Works as a Product/process development scientist. No tobacco, etoh, or drugs. No cardiac complaints. Goes to the gym without issues. CV exam normal. Nonspecific STT changes on EKG.  Problem List Ascending aortic aneurysm  -43 mm 02/28/2023 -CAC = 0   Past Medical History: Past Medical History:  Diagnosis Date   Allergy    Arthritis    arthritis -pain "back"   Cancer [facility])    Melanoma"lower hip-right" -15 yrs ago   GERD (gastroesophageal reflux disease)    intermittent- uses OTC med   Sleep apnea    settings "automatic" cpap use x2-3 weeks    Past Surgical History: Past Surgical History:  Procedure Laterality Date   COLONOSCOPY     COLONOSCOPY WITH PROPOFOL N/A 12/19/2015   Procedure: COLONOSCOPY WITH PROPOFOL;  Surgeon: Charolett Bumpers, MD;  [facility]: [facility];  Service: Endoscopy;  Laterality: N/A;   MELANOMA EXCISION Right    right hip 15 yrs ago    Current Medications: No outpatient medications have been marked as taking for the 06/05/23 encounter (Office Visit) with Sande Rives, MD.     Allergies:    Patient has no known allergies.   Social History: Social History   Socioeconomic History   Marital status: Married    Spouse name: Not on file   Number of children: 2   Years of education: Not on file   Highest education level:  Not on file  Occupational History   Occupation: Product/process development scientist  Tobacco Use   Smoking status: Never   Smokeless tobacco: Not on file  Vaping Use   Vaping status: Never Used  Substance and Sexual Activity   Alcohol use: No   Drug use: No   Sexual activity: Yes  Other Topics Concern   Not on file  Social History Narrative   Product/process development scientist   Social Determinants of Health   Financial Resource Strain: Not on file  Food Insecurity: Not on file  Transportation Needs: Not on file  Physical Activity: Not on file  Stress: Not on file  Social Connections: Not on file     Family History: The patient's family history includes Cancer (age of onset: 21) in his maternal grandmother; Heart disease (age of onset: 23) in his maternal grandfather.  ROS:   All other ROS reviewed and negative. Pertinent positives noted in the HPI.     EKGs/Labs/Other Studies Reviewed:   The following studies were personally reviewed by me today:  EKG:  EKG is ordered today.    EKG Interpretation Date/Time:  Wednesday June 05 2023 15:50:54 EDT Ventricular Rate:  69 PR Interval:  164 QRS Duration:  96 QT Interval:  366 QTC Calculation: 392 R Axis:   55  Text Interpretation: Normal sinus  rhythm Nonspecific ST abnormality Confirmed by Lennie Odor [phone]) on 06/05/2023 3:52:33 PM   Recent Labs: No results found for requested labs within last 365 days.   Recent Lipid Panel No results found for: "CHOL", "TRIG", "HDL", "CHOLHDL", "VLDL", "LDLCALC", "LDLDIRECT"  Physical Exam:   VS:  BP 128/82 (BP Location: Left Arm, Patient Position: Sitting, Cuff Size: Normal)   Pulse 84   Ht 5\' 11"  (1.803 m)   Wt 208 lb 12.8 oz (94.7 kg)   SpO2 92%   BMI 29.12 kg/m    Wt Readings from Last 3 Encounters:  06/05/23 208 lb 12.8 oz (94.7 kg)  09/20/22 208 lb (94.3 kg)  08/29/22 208 lb 4 oz (94.5 kg)    General: Well nourished, well developed, in no acute distress Head: Atraumatic, normal size  Eyes:  PEERLA, EOMI  Neck: Supple, no JVD Endocrine: No thryomegaly Cardiac: Normal S1, S2; RRR; no murmurs, rubs, or gallops Lungs: Clear to auscultation bilaterally, no wheezing, rhonchi or rales  Abd: Soft, nontender, no hepatomegaly  Ext: No edema, pulses 2+ Musculoskeletal: No deformities, BUE and BLE strength normal and equal Skin: Warm and dry, no rashes   Neuro: Alert and oriented to person, place, time, and situation, CNII-XII grossly intact, no focal deficits  Psych: Normal mood and affect   ASSESSMENT:   Isaac [name] is a 57 y.o. male who presents for the following: 1. Aneurysm of ascending aorta without rupture (HCC)   2. Screening for cardiovascular condition     PLAN:   1. Aneurysm of ascending aorta without rupture (HCC) 2. Screening for cardiovascular condition -43 mm on noncon chest CT. Echo to exclude bicuspid aortic valve. Likely sporadic as no fam hx of this. CAC =0. No restrictions on activity. Will repeat CT in 1 year.   Disposition: Return in about 1 year (around 06/04/2024).  Medication Adjustments/Labs and Tests Ordered: Current medicines are reviewed at length with the patient today.  Concerns regarding medicines are outlined above.  Orders Placed This Encounter  Procedures   EKG 12-Lead   ECHOCARDIOGRAM COMPLETE   No orders of the defined types were placed in this encounter.  Patient Instructions  Medication Instructions:  Your physician recommends that you continue on your current medications as directed. Please refer to the Current Medication list given to you today.  *If you need a refill on your cardiac medications before your next appointment, please call your pharmacy*      Testing/Procedures: Echo will be scheduled at [address].  Your physician has requested that you have an echocardiogram. Echocardiography is a painless test that uses sound waves to create images of your heart. It provides your doctor with information about the  size and shape of your heart and how well your heart's chambers and valves are working. This procedure takes approximately one hour. There are no restrictions for this procedure. Please do NOT wear cologne, perfume, aftershave, or lotions (deodorant is allowed). Please arrive 15 minutes prior to your appointment time.    Follow-Up: At [facility], you and your health needs are our priority.  As part of our continuing mission to provide you with exceptional heart care, we have created designated Provider Care Teams.  These Care Teams include your primary Cardiologist (physician) and Advanced Practice Providers (APPs -  Physician Assistants and Nurse Practitioners) who all work together to provide you with the care you need, when you need it.  We recommend signing up for the patient  portal called "MyChart".  Sign up information is provided on this After Visit Summary.  MyChart is used to connect with patients for Virtual Visits (Telemedicine).  Patients are able to view lab/test results, encounter notes, upcoming appointments, etc.  Non-urgent messages can be sent to your provider as well.   To learn more about what you can do with MyChart, go to [email or web address].    Your next appointment:   1 year(s)  Provider:   Reatha Harps, MD       Signed, Lenna Gilford. Flora Lipps, MD, [facility]  [facility]  [address], [address] [place], [place] [address] [phone]  06/05/2023 4:12 PM

## 2023-06-05 ENCOUNTER — Encounter: Payer: Self-pay | Admitting: Cardiovascular Disease

## 2023-06-05 ENCOUNTER — Ambulatory Visit: Payer: 59 | Attending: Cardiovascular Disease | Admitting: Cardiovascular Disease

## 2023-06-05 VITALS — BP 128/82 | HR 84 | Ht 71.0 in | Wt 208.8 lb

## 2023-06-05 DIAGNOSIS — I7121 Aneurysm of the ascending aorta, without rupture: Secondary | ICD-10-CM | POA: Diagnosis not present

## 2023-06-05 DIAGNOSIS — Z136 Encounter for screening for cardiovascular disorders: Secondary | ICD-10-CM

## 2023-06-05 NOTE — Patient Instructions (Signed)
Medication Instructions:  Your physician recommends that you continue on your current medications as directed. Please refer to the Current Medication list given to you today.  *If you need a refill on your cardiac medications before your next appointment, please call your pharmacy*      Testing/Procedures: Echo will be scheduled at [address].  Your physician has requested that you have an echocardiogram. Echocardiography is a painless test that uses sound waves to create images of your heart. It provides your doctor with information about the size and shape of your heart and how well your heart's chambers and valves are working. This procedure takes approximately one hour. There are no restrictions for this procedure. Please do NOT wear cologne, perfume, aftershave, or lotions (deodorant is allowed). Please arrive 15 minutes prior to your appointment time.    Follow-Up: At [facility], you and your health needs are our priority.  As part of our continuing mission to provide you with exceptional heart care, we have created designated Provider Care Teams.  These Care Teams include your primary Cardiologist (physician) and Advanced Practice Providers (APPs -  Physician Assistants and Nurse Practitioners) who all work together to provide you with the care you need, when you need it.  We recommend signing up for the patient portal called "MyChart".  Sign up information is provided on this After Visit Summary.  MyChart is used to connect with patients for Virtual Visits (Telemedicine).  Patients are able to view lab/test results, encounter notes, upcoming appointments, etc.  Non-urgent messages can be sent to your provider as well.   To learn more about what you can do with MyChart, go to [email or web address].    Your next appointment:   1 year(s)  Provider:   Reatha Harps, MD

## 2023-06-13 DIAGNOSIS — R3121 Asymptomatic microscopic hematuria: Secondary | ICD-10-CM | POA: Diagnosis not present

## 2023-06-13 DIAGNOSIS — E291 Testicular hypofunction: Secondary | ICD-10-CM | POA: Diagnosis not present

## 2023-07-08 ENCOUNTER — Ambulatory Visit: Payer: 59 | Attending: Cardiovascular Disease

## 2023-07-15 ENCOUNTER — Encounter (HOSPITAL_COMMUNITY): Payer: Self-pay | Admitting: Cardiovascular Disease

## 2024-02-17 ENCOUNTER — Other Ambulatory Visit: Payer: Self-pay | Admitting: Family Medicine

## 2024-02-17 DIAGNOSIS — I7121 Aneurysm of the ascending aorta, without rupture: Secondary | ICD-10-CM

## 2024-02-24 DIAGNOSIS — H8112 Benign paroxysmal vertigo, left ear: Secondary | ICD-10-CM | POA: Diagnosis not present

## 2024-02-24 DIAGNOSIS — R1110 Vomiting, unspecified: Secondary | ICD-10-CM | POA: Diagnosis not present

## 2024-02-24 DIAGNOSIS — R42 Dizziness and giddiness: Secondary | ICD-10-CM | POA: Diagnosis not present

## 2024-02-25 DIAGNOSIS — H8112 Benign paroxysmal vertigo, left ear: Secondary | ICD-10-CM | POA: Diagnosis not present

## 2024-04-01 ENCOUNTER — Ambulatory Visit
Admission: RE | Admit: 2024-04-01 | Discharge: 2024-04-01 | Discharge status: Home / Self Care | Source: Ambulatory Visit | Attending: Family Medicine | Admitting: Family Medicine

## 2024-04-01 DIAGNOSIS — I7121 Aneurysm of the ascending aorta, without rupture: Secondary | ICD-10-CM

## 2024-04-01 DIAGNOSIS — E041 Nontoxic single thyroid nodule: Secondary | ICD-10-CM | POA: Diagnosis not present

## 2024-04-08 DIAGNOSIS — E559 Vitamin D deficiency, unspecified: Secondary | ICD-10-CM | POA: Diagnosis not present

## 2024-04-08 DIAGNOSIS — Z0000 Encounter for general adult medical examination without abnormal findings: Secondary | ICD-10-CM | POA: Diagnosis not present

## 2024-05-06 ENCOUNTER — Ambulatory Visit: Attending: Cardiology | Admitting: Cardiology

## 2024-05-06 ENCOUNTER — Encounter: Payer: Self-pay | Admitting: Cardiology

## 2024-05-06 ENCOUNTER — Ambulatory Visit: Admitting: Cardiology

## 2024-05-06 VITALS — BP 112/74 | HR 88 | Ht 71.0 in | Wt 211.2 lb

## 2024-05-06 DIAGNOSIS — Z8249 Family history of ischemic heart disease and other diseases of the circulatory system: Secondary | ICD-10-CM

## 2024-05-06 DIAGNOSIS — I7121 Aneurysm of the ascending aorta, without rupture: Secondary | ICD-10-CM

## 2024-05-06 NOTE — Progress Notes (Signed)
 Cardiology Office Note:   Date:  05/06/2024  ID:  Isaac [name], DOB 1966/07/06, MRN [id number] PCP: Okey Carlin Redbird, MD  [facility] Providers Cardiologist:  Darryle ONEIDA Decent, MD    History of Present Illness:   Discussed the use of AI scribe software for clinical note transcription with the patient, who gave verbal consent to proceed.  History of Present Illness Isaac [name] is a 58 year old male with an aortic aneurysm who presents for routine follow-up. He was initially referred by his primary care physician to cardiology for management of his aortic aneurysm.  He was incidentally found to have an aortic aneurysm in 2023 and has been under cardiology care for monitoring. Serial imaging has been conducted to monitor the aortic aneurysm. In May 2024, non contrast CT cheset imaging showed ectasia of the ascending thoracic aorta measuring 4.3 cm in diameter. A repeat imaging study in June was performed, and the patient was told there was no significant change compared to prior imaging.   He has concerns about undergoing CT angiography with contrast due to potential allergies or side effects, although he has no known kidney disease and has not had a contrast study before.  He has a family history of cardiac disease on his mother's side, with his grandfather having died of a massive heart attack. However, his parents and grandmother have no known heart issues and lived into their eighties. No family history of aortic issues.   Today patient denies chest pain, shortness of breath, lower extremity edema, fatigue, palpitations, melena, hematuria, hemoptysis, diaphoresis, weakness, presyncope, syncope, orthopnea, and PND.   Studies Reviewed:    EKG:   EKG Interpretation Date/Time:  Wednesday May 06 2024 14:32:09 EDT Ventricular Rate:  88 PR Interval:  168 QRS Duration:  90 QT Interval:  344 QTC Calculation: 416 R Axis:   22  Text Interpretation: Normal sinus rhythm Minimal  voltage criteria for LVH, may be normal variant ( R in aVL ) Stable nonspecific TWI in leads III, aVF Confirmed by Trudy Birmingham [phone]) on 05/06/2024 2:42:24 PM    07/27/22 Coronary Calcium Scoring CT  IMPRESSION: 1. Patient's total coronary artery calcium score is 0 which indicates a very low (but non-zero) risk of significant coronary artery atherosclerosis. 2. Ectasia of ascending thoracic aorta (4.4 cm in diameter).  04/01/24 CT Chest 1. Dilated 4.4 cm ascending thoracic aorta, stable using similar measurement technique. Recommend annual imaging followup by CTA or MRA.   Risk Assessment/Calculations:              Physical Exam:   VS:  BP 112/74   Pulse 88   Ht 5' 11 (1.803 m)   Wt 211 lb 3.2 oz (95.8 kg)   SpO2 98%   BMI 29.46 kg/m    Wt Readings from Last 3 Encounters:  05/06/24 211 lb 3.2 oz (95.8 kg)  06/05/23 208 lb 12.8 oz (94.7 kg)  09/20/22 208 lb (94.3 kg)     Physical Exam Vitals reviewed.  Constitutional:      Appearance: Normal appearance.  HENT:     Head: Normocephalic.  Eyes:     Pupils: Pupils are equal, round, and reactive to light.  Cardiovascular:     Rate and Rhythm: Normal rate and regular rhythm.     Pulses: Normal pulses.     Heart sounds: Normal heart sounds.  Pulmonary:     Effort: Pulmonary effort is normal.     Breath sounds: Normal breath sounds.  Abdominal:     General: Abdomen is flat.     Palpations: Abdomen is soft.  Musculoskeletal:     Right lower leg: No edema.     Left lower leg: No edema.  Skin:    General: Skin is warm and dry.     Capillary Refill: Capillary refill takes less than 2 seconds.  Neurological:     General: No focal deficit present.     Mental Status: He is alert and oriented to person, place, and time.  Psychiatric:        Mood and Affect: Mood normal.        Behavior: Behavior normal.        Thought Content: Thought content normal.        Judgment: Judgment normal.     ASSESSMENT AND PLAN:     Assessment & Plan Aortic aneurysm Ascending thoracic aortic aneurysm measuring 4.4 cm on 2025 noncontrast chest CT, unchanged from previous imaging. Imaging thus far without contrast. Discussed the importance of CT angiography with contrast for accurate monitoring. He expressed concerns about allergic reactions and side effects of contrast. Explained low risk of complications with normal renal function. Emphasized monitoring to prevent life-threatening complications such as dissection or rupture, which would require major surgery. - Discuss with Dr. Barbaraann regarding the need for CT angiography with contrast.   Hyperlipidemia Elevated LDL cholesterol levels, likely influenced by genetic factors given family history of cardiac disease. He has been proactive in dietary modifications, resulting in some improvement in cholesterol levels. Discussed dietary recommendations, including a plant-based diet, Mediterranean diet, and the inclusion of nuts, healthy fats, and fiber to support HDL levels and counteract LDL. - Monitor cholesterol levels and consider future need for cholesterol-lowering medication if dietary changes are insufficient.           Signed, Artist Pouch, PA-C

## 2024-05-06 NOTE — Patient Instructions (Signed)
 Medication Instructions:  Your physician recommends that you continue on your current medications as directed. Please refer to the Current Medication list given to you today.  *If you need a refill on your cardiac medications before your next appointment, please call your pharmacy*  Lab Work: NONE ORDERED  If you have labs (blood work) drawn today and your tests are completely normal, you will receive your results only by: MyChart Message (if you have MyChart) OR A paper copy in the mail If you have any lab test that is abnormal or we need to change your treatment, we will call you to review the results.  Testing/Procedures: NONE ORDERED  Follow-Up: At [facility], you and your health needs are our priority.  As part of our continuing mission to provide you with exceptional heart care, our providers are all part of one team.  This team includes your primary Cardiologist (physician) and Advanced Practice Providers or APPs (Physician Assistants and Nurse Practitioners) who all work together to provide you with the care you need, when you need it.  Your next appointment:   12 month(s)  Provider:   Darryle ONEIDA Decent, MD    We recommend signing up for the patient portal called MyChart.  Sign up information is provided on this After Visit Summary.  MyChart is used to connect with patients for Virtual Visits (Telemedicine).  Patients are able to view lab/test results, encounter notes, upcoming appointments, etc.  Non-urgent messages can be sent to your provider as well.   To learn more about what you can do with MyChart, go to [email or web address].   Other Instructions

## 2024-05-08 ENCOUNTER — Telehealth: Payer: Self-pay | Admitting: Cardiology

## 2024-05-08 DIAGNOSIS — I7121 Aneurysm of the ascending aorta, without rupture: Secondary | ICD-10-CM

## 2024-05-08 NOTE — Telephone Encounter (Signed)
 Pt aware of Dr. Barbaraann & Artist Pouch, PA-C's recommendations.  CT Chest W/O contrast has been ordered for June 2026.  Pt was tankful for the call.

## 2024-05-08 NOTE — Telephone Encounter (Signed)
  Patient with thoracic aortic aneurysm was just seen in office for follow up. Following his visit, discussed CT chest wo contrast vs with for serial monitoring. Per Dr. Barbaraann, ok to continue without contrast. Patient will need CT chest wo contrast ordered for June 2026.   Artist Pouch, PA-C

## 2024-05-15 DIAGNOSIS — E041 Nontoxic single thyroid nodule: Secondary | ICD-10-CM | POA: Diagnosis not present

## 2024-05-15 DIAGNOSIS — E042 Nontoxic multinodular goiter: Secondary | ICD-10-CM | POA: Diagnosis not present

## 2024-05-26 ENCOUNTER — Other Ambulatory Visit: Payer: Self-pay | Admitting: Family Medicine

## 2024-05-26 DIAGNOSIS — E041 Nontoxic single thyroid nodule: Secondary | ICD-10-CM

## 2024-05-27 ENCOUNTER — Encounter: Payer: Self-pay | Admitting: Family Medicine

## 2024-05-29 ENCOUNTER — Ambulatory Visit
Admission: RE | Admit: 2024-05-29 | Discharge: 2024-05-29 | Disposition: A | Discharge status: Home / Self Care | Source: Ambulatory Visit | Attending: Family Medicine | Admitting: Family Medicine

## 2024-05-29 ENCOUNTER — Other Ambulatory Visit (HOSPITAL_COMMUNITY)
Admission: RE | Admit: 2024-05-29 | Discharge: 2024-05-29 | Disposition: A | Discharge status: Home / Self Care | Source: Ambulatory Visit | Attending: Family Medicine | Admitting: Family Medicine

## 2024-05-29 DIAGNOSIS — E079 Disorder of thyroid, unspecified: Secondary | ICD-10-CM | POA: Diagnosis not present

## 2024-05-29 DIAGNOSIS — E041 Nontoxic single thyroid nodule: Secondary | ICD-10-CM

## 2024-05-29 NOTE — Procedures (Signed)
 PROCEDURE SUMMARY:  Technically successful ultrasound guided thyroid  nodule FNA.  No immediate complications.  Patient tolerated well.  EBL = trace Specimens were sent to Pathology for analysis.  Please see full dictation in imaging section of Epic for procedure details.  Meleni Delahunt H Slater Mcmanaman PA-C 05/29/2024 3:45 PM

## 2024-06-02 LAB — CYTOLOGY - NON PAP

## 2024-06-13 DIAGNOSIS — E041 Nontoxic single thyroid nodule: Secondary | ICD-10-CM | POA: Diagnosis not present

## 2024-06-16 ENCOUNTER — Encounter (HOSPITAL_COMMUNITY): Payer: Self-pay

## 2024-07-31 DIAGNOSIS — G8929 Other chronic pain: Secondary | ICD-10-CM | POA: Diagnosis not present

## 2024-07-31 DIAGNOSIS — M25511 Pain in right shoulder: Secondary | ICD-10-CM | POA: Diagnosis not present
